# Patient Record
Sex: Female | Born: 1975 | Hispanic: No | Marital: Married | Smoking: Never smoker
Health system: Southern US, Community
[De-identification: ages and names within clinical notes are randomized; demographics above are authoritative.]

## PROBLEM LIST (undated history)

## (undated) DIAGNOSIS — E079 Disorder of thyroid, unspecified: Secondary | ICD-10-CM

## (undated) DIAGNOSIS — I10 Essential (primary) hypertension: Secondary | ICD-10-CM

## (undated) DIAGNOSIS — E0590 Thyrotoxicosis, unspecified without thyrotoxic crisis or storm: Secondary | ICD-10-CM

## (undated) DIAGNOSIS — G43909 Migraine, unspecified, not intractable, without status migrainosus: Secondary | ICD-10-CM

---

## 2008-04-18 ENCOUNTER — Inpatient Hospital Stay (HOSPITAL_COMMUNITY): Admission: AD | Admit: 2008-04-18 | Discharge: 2008-04-18 | Payer: Self-pay | Admitting: Gynecology

## 2008-04-23 ENCOUNTER — Inpatient Hospital Stay (HOSPITAL_COMMUNITY): Admission: RE | Admit: 2008-04-23 | Discharge: 2008-04-23 | Payer: Self-pay | Admitting: Gynecology

## 2008-07-09 ENCOUNTER — Inpatient Hospital Stay (HOSPITAL_COMMUNITY): Admission: AD | Admit: 2008-07-09 | Discharge: 2008-07-09 | Payer: Self-pay | Admitting: Obstetrics & Gynecology

## 2008-11-26 ENCOUNTER — Inpatient Hospital Stay (HOSPITAL_COMMUNITY): Admission: AD | Admit: 2008-11-26 | Discharge: 2008-11-28 | Payer: Self-pay | Admitting: Obstetrics and Gynecology

## 2009-03-09 IMAGING — CR DG CHEST 2V
2 series · 2 of 2 positions shown · non-contrast
Comparison: None

CLINICAL DATA: 16 weeks estimated gestational age with bronchitis
and shortness of breath

CHEST - 2 VIEW

[view not recorded (1 of 2)]
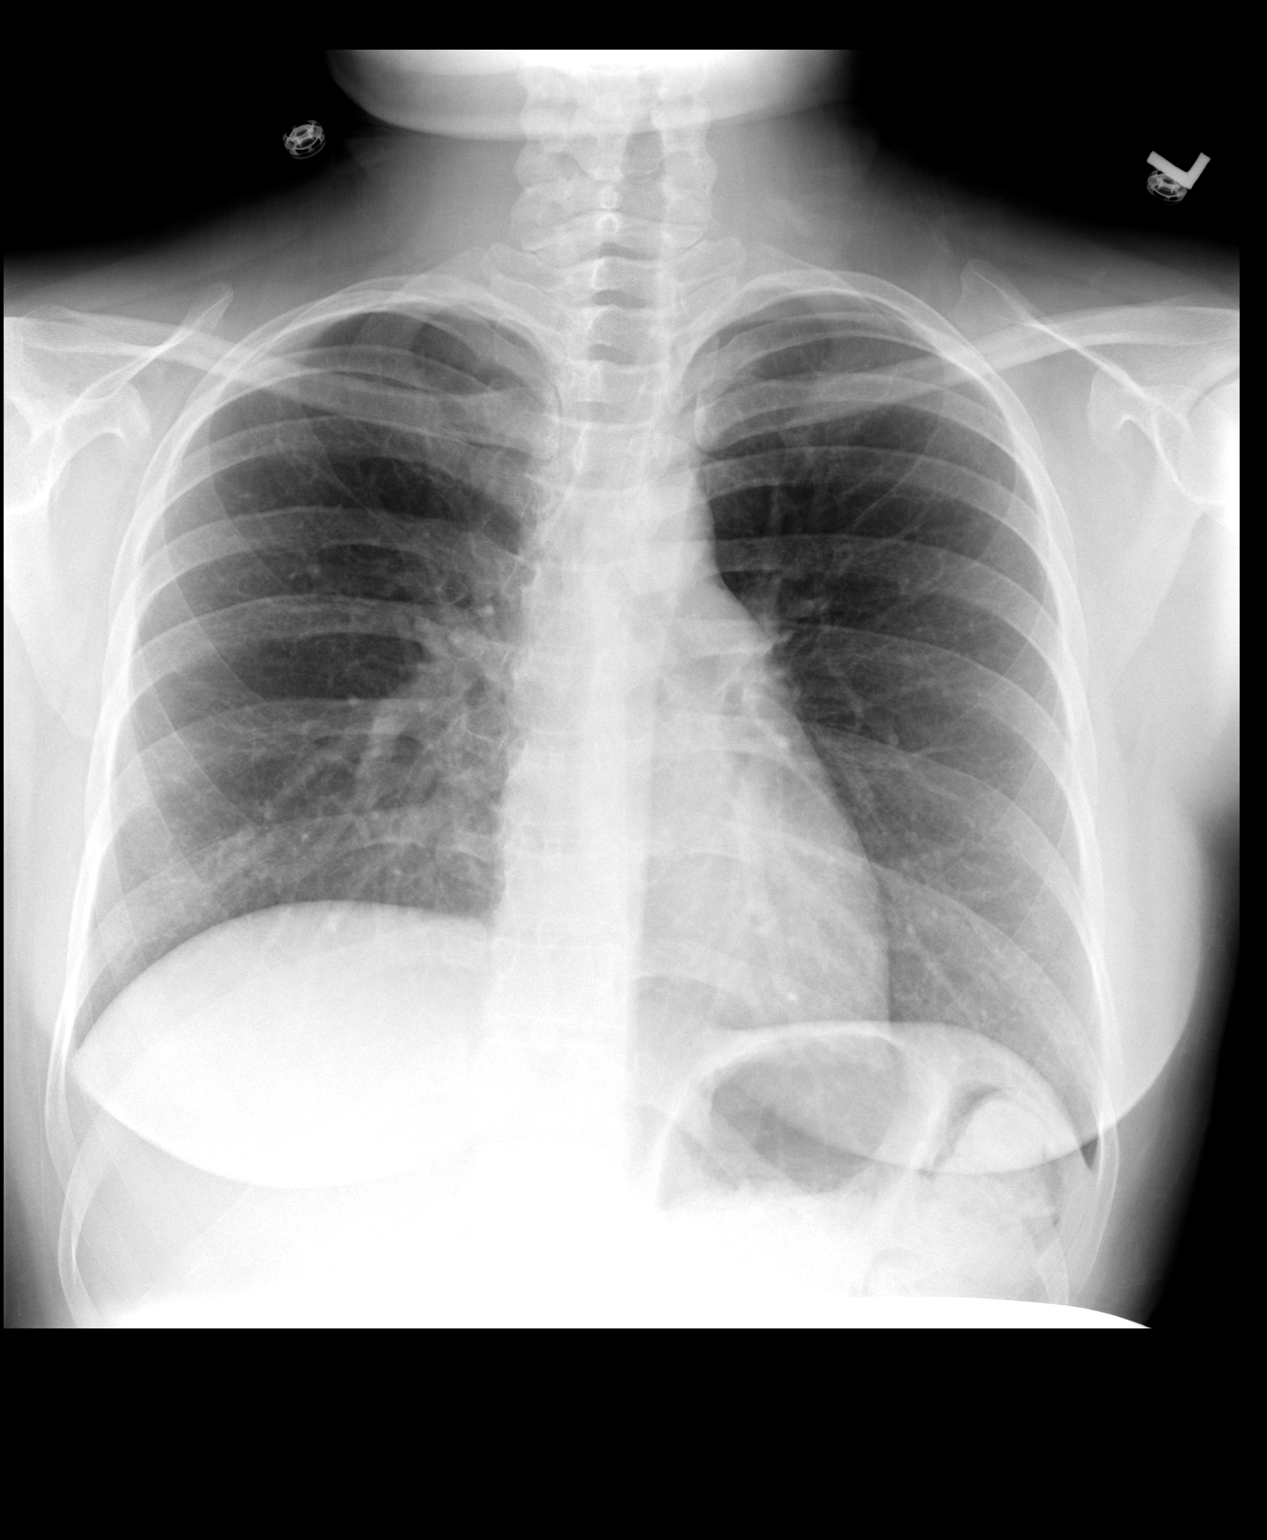

[view not recorded (2 of 2)]
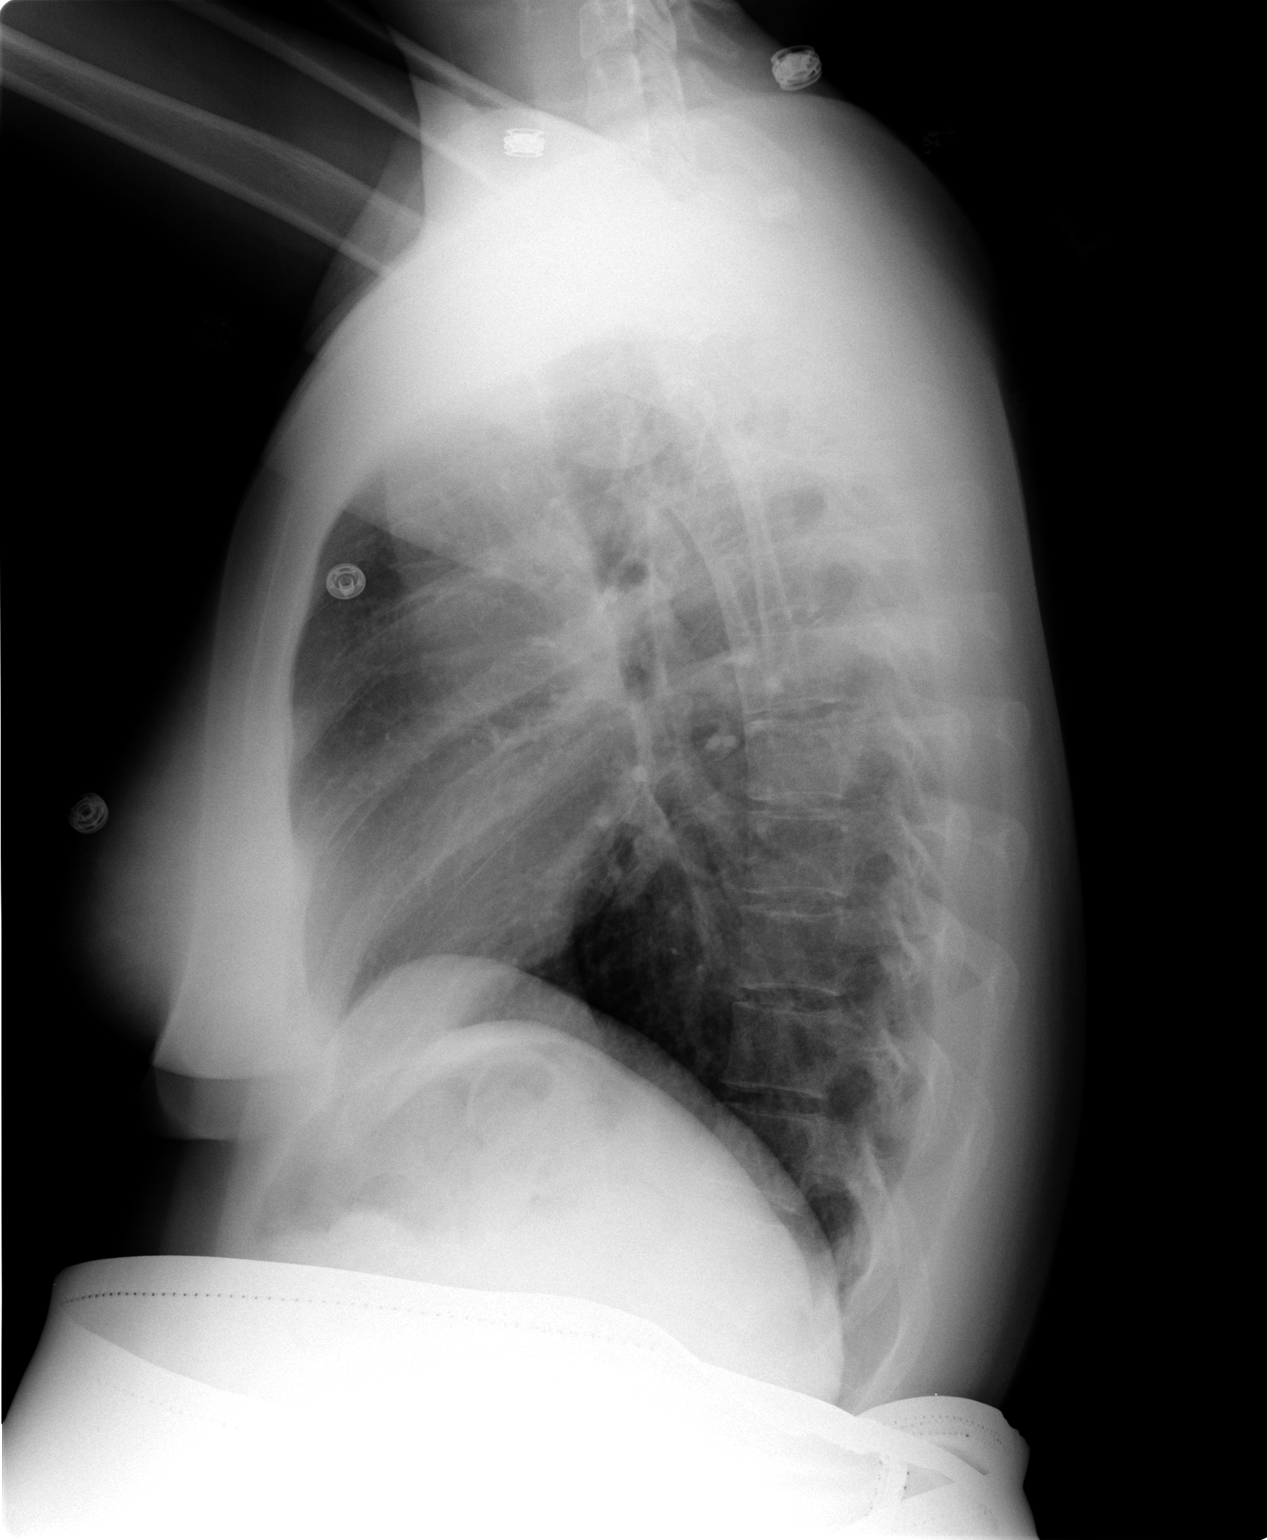

[2 of 2 positions shown; findings below may reference images not displayed]

FINDINGS: Heart and mediastinal contours are within normal limits.
Lung fields appear clear with no evidence for focal infiltrate or
congestive failure.  Bony structures appear intact.  Signs of
peribronchial cuffing are seen to suggest bronchitis
radiographically.
IMPRESSION: No acute disease.

## 2009-06-07 ENCOUNTER — Emergency Department (HOSPITAL_BASED_OUTPATIENT_CLINIC_OR_DEPARTMENT_OTHER): Admission: EM | Admit: 2009-06-07 | Discharge: 2009-06-07 | Payer: Self-pay | Admitting: Emergency Medicine

## 2009-06-07 ENCOUNTER — Ambulatory Visit: Payer: Self-pay | Admitting: Diagnostic Radiology

## 2009-06-17 ENCOUNTER — Inpatient Hospital Stay (HOSPITAL_COMMUNITY): Admission: AD | Admit: 2009-06-17 | Discharge: 2009-06-17 | Payer: Self-pay | Admitting: Obstetrics & Gynecology

## 2010-02-05 IMAGING — CR DG CHEST 2V
2 series · 2 of 2 positions shown · non-contrast
Comparison: Chest radiograph 07/09/2008

CLINICAL DATA: Cough, short of breath

CHEST - 2 VIEW

[w chest pa]
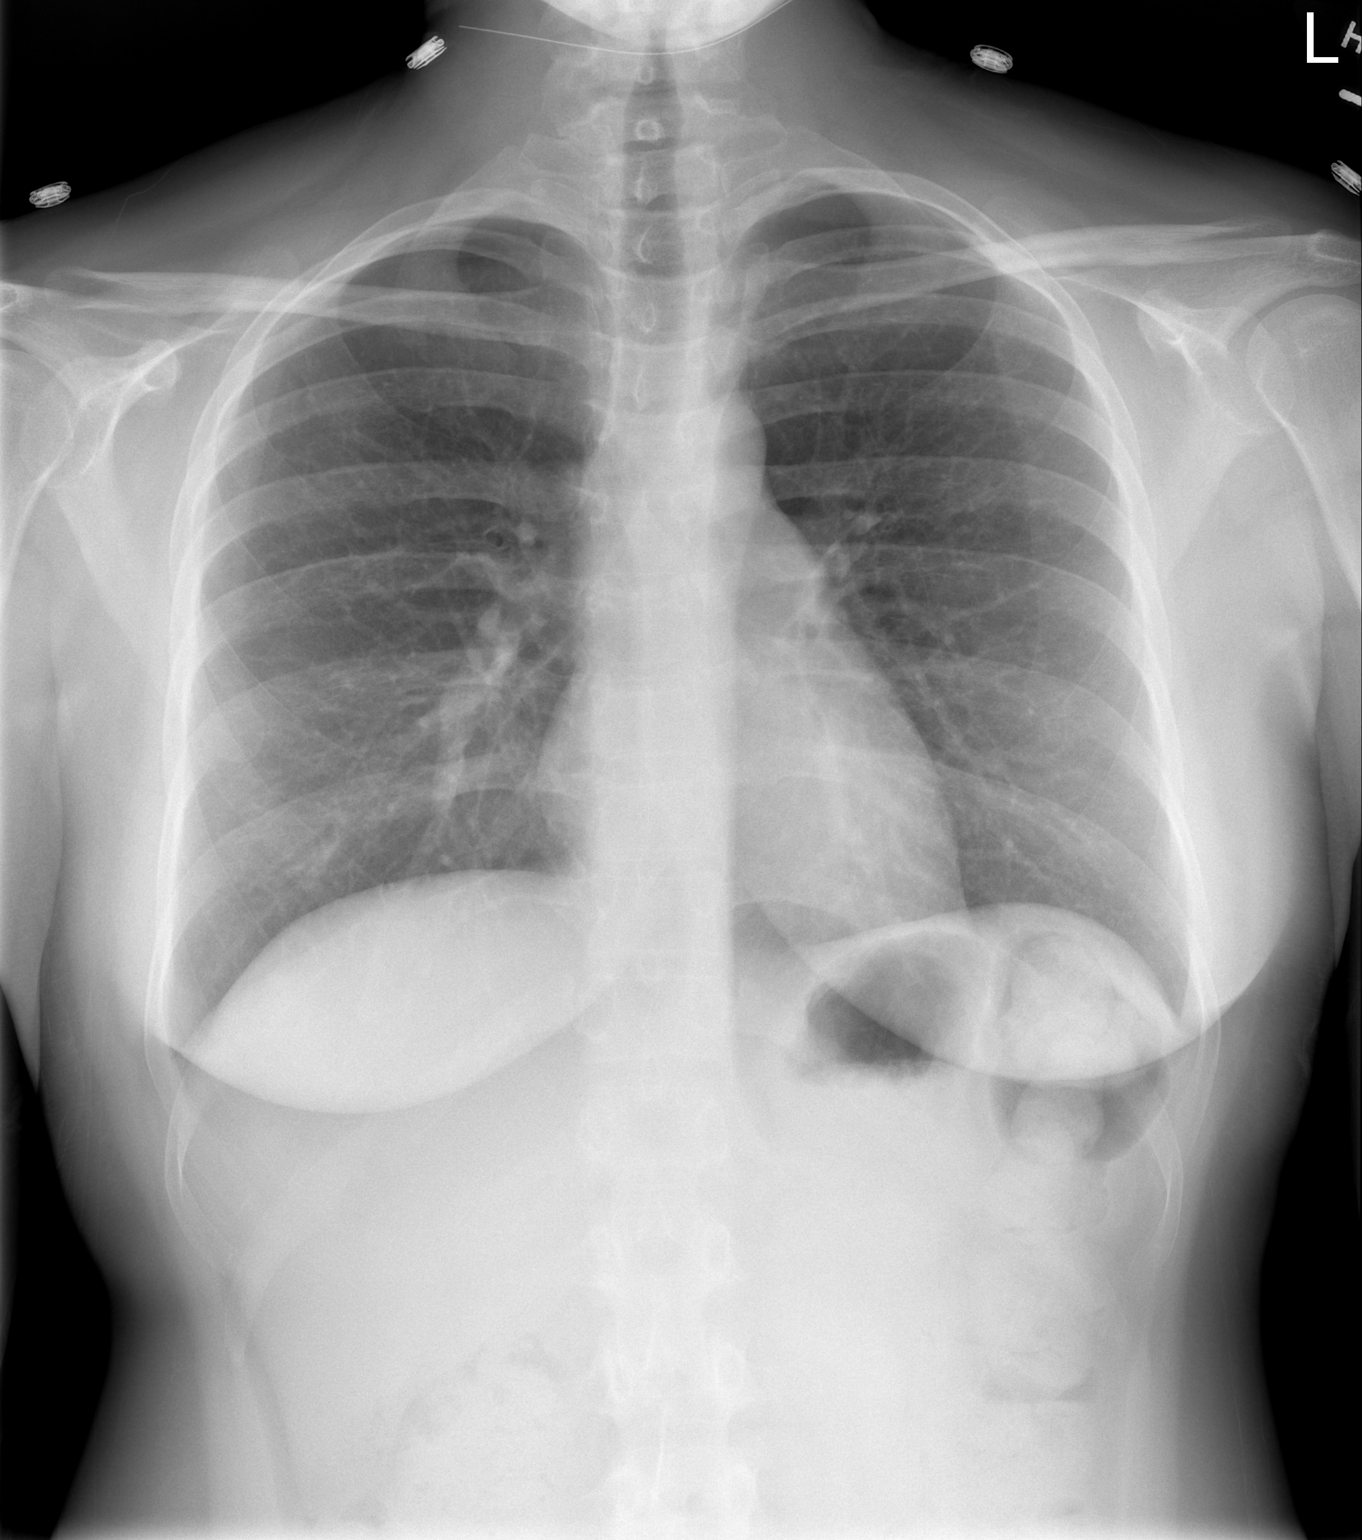

[w chest lat]
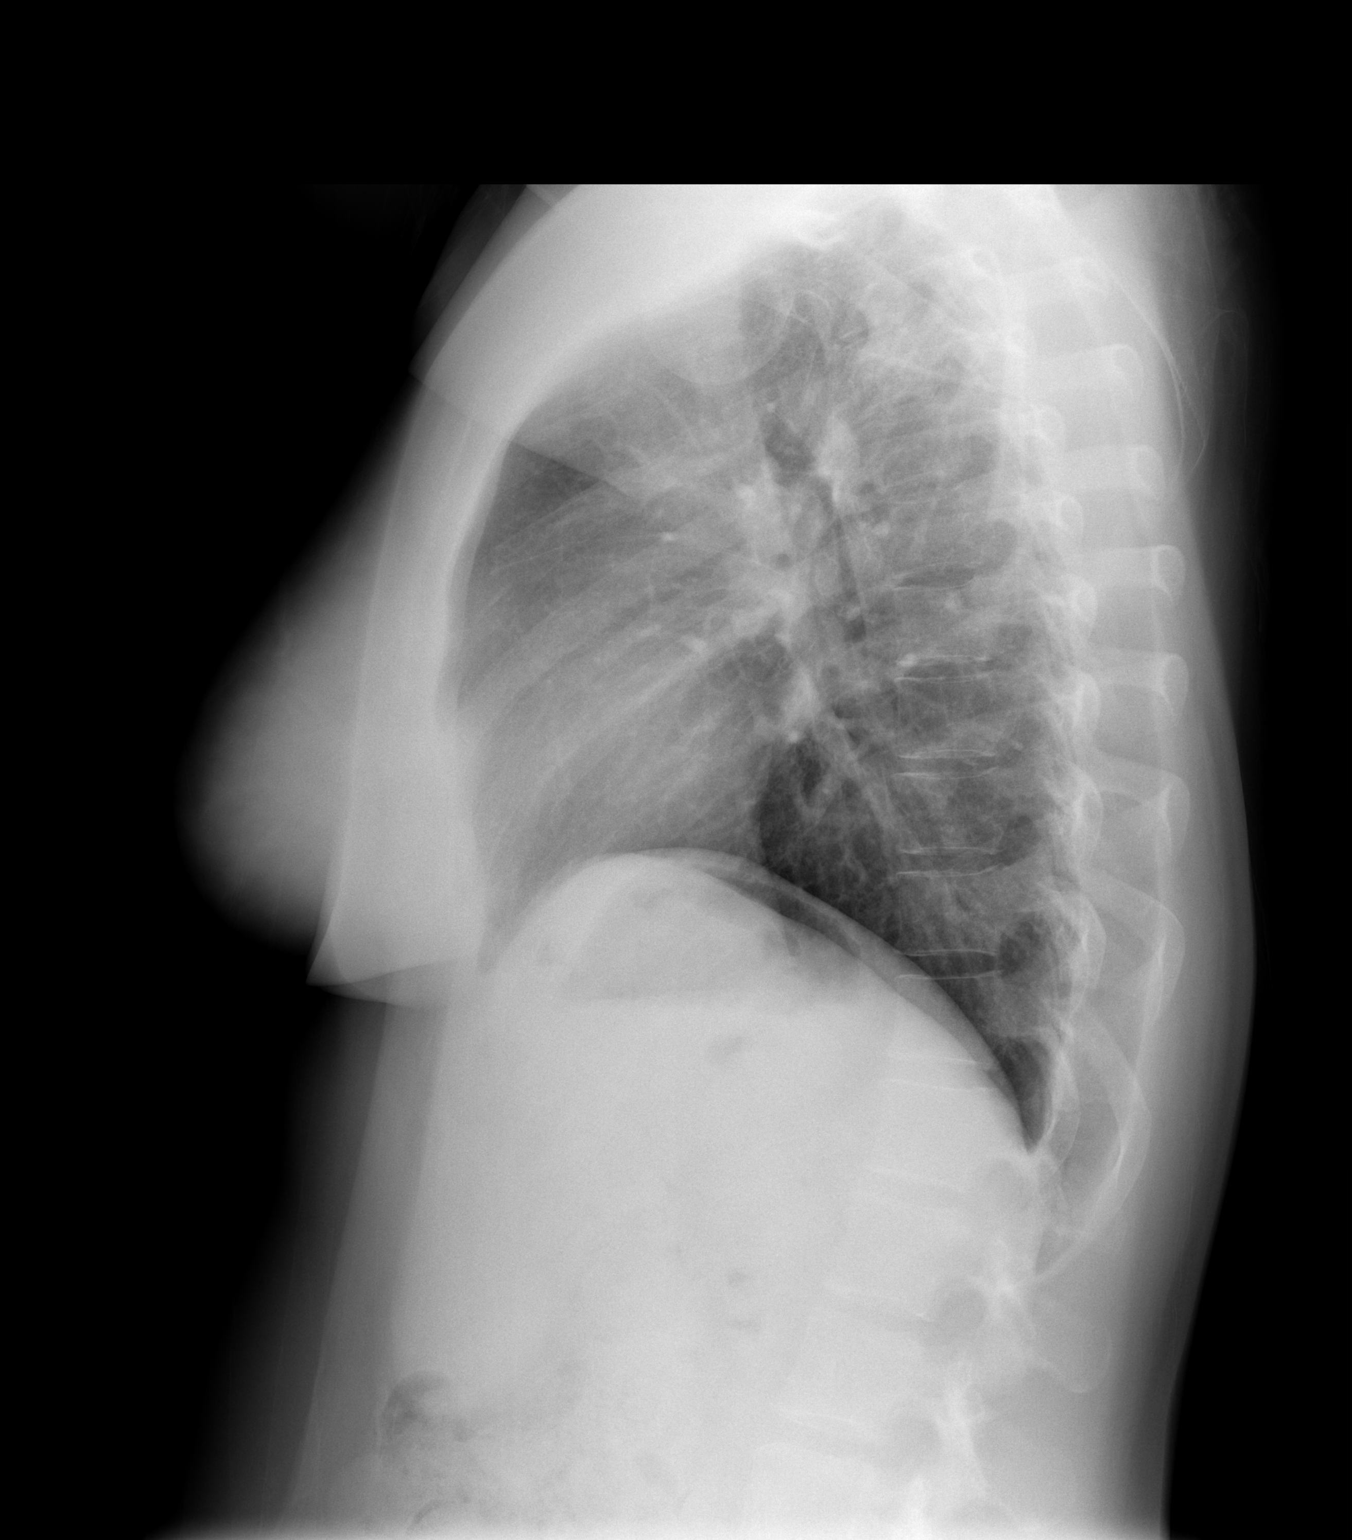

[2 of 2 positions shown; findings below may reference images not displayed]

FINDINGS: Normal mediastinum and cardiac silhouette.  Costophrenic
angles are clear.  No evidence effusion, infiltrate, or
pneumothorax.
IMPRESSION: No acute cardiopulmonary process.

## 2010-12-29 LAB — CBC
HCT: 41.7 % (ref 36.0–46.0)
Hemoglobin: 14 g/dL (ref 12.0–15.0)
MCHC: 33.5 g/dL (ref 30.0–36.0)
Platelets: 389 10*3/uL (ref 150–400)
RBC: 4.63 MIL/uL (ref 3.87–5.11)

## 2010-12-29 LAB — POCT PREGNANCY, URINE: Preg Test, Ur: NEGATIVE

## 2010-12-29 LAB — BASIC METABOLIC PANEL
BUN: 22 mg/dL (ref 6–23)
Chloride: 102 mEq/L (ref 96–112)
Glucose, Bld: 120 mg/dL — ABNORMAL HIGH (ref 70–99)
Potassium: 3 mEq/L — ABNORMAL LOW (ref 3.5–5.1)
Sodium: 137 mEq/L (ref 135–145)

## 2010-12-29 LAB — URINALYSIS, ROUTINE W REFLEX MICROSCOPIC
Bilirubin Urine: NEGATIVE
Hgb urine dipstick: NEGATIVE
Ketones, ur: NEGATIVE mg/dL
Nitrite: NEGATIVE
Protein, ur: NEGATIVE mg/dL
pH: 5.5 (ref 5.0–8.0)

## 2011-01-04 LAB — LACTATE DEHYDROGENASE
LDH: 116 U/L (ref 94–250)
LDH: 155 U/L (ref 94–250)

## 2011-01-04 LAB — CBC
Hemoglobin: 10.9 g/dL — ABNORMAL LOW (ref 12.0–15.0)
Hemoglobin: 12.1 g/dL (ref 12.0–15.0)
MCHC: 33.3 g/dL (ref 30.0–36.0)
MCV: 91 fL (ref 78.0–100.0)
Platelets: 198 10*3/uL (ref 150–400)
RBC: 3.52 MIL/uL — ABNORMAL LOW (ref 3.87–5.11)
RBC: 3.98 MIL/uL (ref 3.87–5.11)
RDW: 14.3 % (ref 11.5–15.5)
WBC: 12 10*3/uL — ABNORMAL HIGH (ref 4.0–10.5)

## 2011-01-04 LAB — COMPREHENSIVE METABOLIC PANEL
ALT: 16 U/L (ref 0–35)
AST: 22 U/L (ref 0–37)
Albumin: 2.5 g/dL — ABNORMAL LOW (ref 3.5–5.2)
Alkaline Phosphatase: 126 U/L — ABNORMAL HIGH (ref 39–117)
Alkaline Phosphatase: 160 U/L — ABNORMAL HIGH (ref 39–117)
BUN: 6 mg/dL (ref 6–23)
CO2: 21 mEq/L (ref 19–32)
CO2: 24 mEq/L (ref 19–32)
Calcium: 9 mg/dL (ref 8.4–10.5)
Calcium: 9.1 mg/dL (ref 8.4–10.5)
Chloride: 107 mEq/L (ref 96–112)
Creatinine, Ser: 0.7 mg/dL (ref 0.4–1.2)
GFR calc non Af Amer: >60 mL/min (ref >60)
Glucose, Bld: 73 mg/dL (ref 70–99)
Glucose, Bld: 77 mg/dL (ref 70–99)

## 2011-01-04 LAB — URIC ACID
Uric Acid, Serum: 6 mg/dL (ref 2.4–7.0)
Uric Acid, Serum: 6.3 mg/dL (ref 2.4–7.0)

## 2011-01-04 LAB — URINALYSIS, DIPSTICK ONLY
Hgb urine dipstick: NEGATIVE
Leukocytes, UA: NEGATIVE
Specific Gravity, Urine: 1.01 (ref 1.005–1.030)
pH: 6 (ref 5.0–8.0)

## 2011-01-04 LAB — PROTEIN, URINE, 24 HOUR
Protein, 24H Urine: 609 mg/d — ABNORMAL HIGH (ref 50–100)
Protein, Urine: 21 mg/dL

## 2011-01-04 LAB — RPR: RPR Ser Ql: NONREACTIVE

## 2011-02-06 NOTE — H&P (Signed)
[name], [name]              ACCOUNT NO.:  [id number]   MEDICAL RECORD NO.:  [id number]          PATIENT TYPE:  INP   LOCATION:  9164                          FACILITY:  [facility]   PHYSICIAN:  [name], M.D. DATE OF BIRTH:  Mar 28, 1976   DATE OF ADMISSION:  11/26/2008  DATE OF DISCHARGE:                              HISTORY & PHYSICAL   Patient is a 35 year old married black female, gravida 4, para 2-0-[name]-2  at 36-2/7 weeks for an [name] of December 22, 2008.  She presents with chief  complaint of spontaneous rupture of membranes at 6:30 a.m., as well as  contractions.  She does have labored breathing, as well as moderate  discomfort, good fetal movement, some pink to her discharge that she has  had since the leakage.  She reports that she has not had her GBS culture  done in the office yet.  She has been followed by the M.D. service at  [name].   HISTORY:  Remarkable for:  [name]. History of rapid labor.  2. History of postpartum depression.  3. Family stress.  4. History of anemia.  5. History of asthma.  6. History of SCDs.  7. Migraines.  8. History of mitral valve prolapse.   She denies headache or visual disturbances with the exception of phlegm-  like lint in my eyes which she notices from time to time and no right  upper quadrant pain.   OBSTETRICAL HISTORY:  [name] was an induced abortion 6 to 8 weeks'  gestation in 1996, no complications.  Gravida 2, spontaneous vaginal  delivery after 3 hours of labor at 39 weeks, that was November of 1998,  a female weighing 5 pounds 12 ounces.  She did have an epidural, no  complications.  [name] is her name.  Gravida 3, spontaneous vaginal  delivery.  Epidural, 6 hours of labor, 38 weeks' gestation, 5 pounds 13  ounces, female, that was in October of 2003.  Gravida 4 is current  pregnancy.   PAST MEDICAL HISTORY:   ALLERGIES:  SHE DENIES MEDICATION OR LATEX ALLERGIES.  NO OTHER  SENSITIVITIES.   MENSTRUAL HISTORY:  Menarche  age 54.  Monthly cycles, no abnormalities.  Reported an LMP of March 15, 2008, which originally gave her an [name] of  December 20, 2008.  She had an ultrasound June 28, 2008, gave her a best  [name] of December 22, 2008.  She has been on iron for anemia with previous  pregnancies.  Hyperemesis with first pregnancy, had to receive fluids.  Third pregnancy, postpartum depression, was put on an antidepressant.  Contraception, she has used birth control pills in the past, vaginal  film, foam, condoms.  Has had a cyst on ovary with 2nd pregnancy but no  problems since.  Treated for chlamydia 35 years of age.  Occasional  yeast infection.  Varicella as a child.  Reports heart murmur during her  2nd pregnancy.  Anemia during pregnancy, on iron supplement with 2nd  child.  Developed asthma with her 3rd pregnancy, has a p.r.n. albuterol  inhaler, reports laughter triggers.  Has had some  issues with some  constipation in the past.  Used to have frequent UTIs due to soft  drinks.  Migraines.  Postpartum depression.  Has fractured her right 4th  toe in the past.  Has had her wisdom teeth out x4.   FAMILY HISTORY:  Dad, high blood pressure, he is deceased.  Mom,  varicosities.  Mom diabetic, maternal grandmother, maternal aunt and an  uncle, paternal grandmother, and paternal uncle as well.  Dad and a  paternal uncle, dialysis.  Mom, migraines.  Dad, colon cancer.  Paternal  grandmother, stomach cancer.  Paternal uncle, throat cancer.  Paternal  grandfather, throat cancer.  Mom, OCD.  Maternal aunt, I am not sure  what kind of psychiatric illness she has.  Dad used drugs and alcohol.   GENETIC HISTORY:  Unremarkable.   SOCIAL HISTORY:  Married black female.  Father of the baby's name is  [name].  Patient has had a high school education.  Works part time  with [name].  Father of the baby has had 13 years of education.  Works  full time paper route maybe.  She denied alcohol, tobacco, or illicit  drug  use.   PRENATAL LABS:  O+.  Rh antibody screen negative.  Sickle cell negative.  RPR nonreactive.  Rubella immune.  Hepatitis surface antigen negative.  HIV nonreactive.  Gonorrhea and chlamydia cultures were negative.  Hemoglobin, May 27, 2008, 12.[name], hematocrit 36.4, platelets were  330.  GBS has not been done as I mentioned.   HISTORY OF PRESENT PREGNANCY:  She entered care for new OB interview  around 15-3/7 weeks, that was on July 01, 2008, same day she had her  new OB workup.  She was transferring from Dr. [name].  On that date,  she was reporting that she had had 2 cardio workups for her palpitations  and the workups were normal.  At that time, was discussion of father of  baby leaving for boot camp the next month and had limited support family  wise.  Father had recently died and does not get along with father of  baby's family as what was on her initial intake.  Her pregravid weight  was 140.  Her weight that day was 146.  She is 5 feet 2.  One of her  daughters did have the flu not long after that.  Had some issues around  the middle of the month of October with cough, congestion, was using her  inhaler.  She was sent to [facility] that day for breathing treatment, chest x-  ray.  She declined H1N1.  Complained of some right leg pain.  She had  her anatomy ultrasound at 18-6/7 weeks.  SIUP normal anatomy and fluid.  Cervix was measuring 3.87 cm.  Discussed with her Motrin for the achy  pain.  She was called in a prescription for Prefera-OB with DHA.  Complained of brown discharge at 22 and 6, as well as some upper  respiratory congestion.  No temperature.  She had been prescribed  Macrobid before.  Fetal fibronectin was done and it was negative.  Cervix was long and closed.  It was again recommended to get both flu  vaccines.  She had her [name]-hour [name] at 26 and 6.  Was referred to  cardiology at that time for palpitations again, had difficulty sleeping.  Plan was made for  ultrasound coming up for size less than dates.  She  was measuring about 2 weeks behind.  Had  an ultrasound at 24 and 6.  Estimated fetal weight was 2 pounds 5 ounces.  Normal fluid vertex.  Posterior placenta grade [name].  Her [name]-hour [name] was abnormal at 137.  It was  scheduled for 3 hours.  Her hemoglobin at that time was 11.4.  Three-  hour [name] was within normal limits.  She did have cold sore with  prescribed acyclovir cream.  She was also having some reflux and was  recommended Tums.  Did have an NSE for contractions.  Had [name] contraction  in 30 minutes.  At 31 weeks, she was weighing about 158.  She did have  3+ glucosuria but her Dextrostix was 120.  She had an ultrasound that  day for size less than dates.  Estimated fetal weight was 3 pounds 6  ounces, 55th to 57th percentile.  AFI was 16.  Cervix was 4.9.  Posterior placenta grade 2 and that 31-week notation in her prenatal  record is available at the time of this dictation.   OBJECTIVE:  VITAL SIGNS ON ADMISSION:  Her blood pressures were 154/106,  163/105, 148/95.  Heart rate 73.  Respirations 21.  Temperature was  98.3.  Fetal heart rate reactive, no decels, 135.  TOCO.  Uterine  contractions every 2 to 4 minutes.  Moderate on palpation.   PHYSICAL EXAM:  GENERAL:  She had labored breathing and grimace but  alert and oriented x3.  HEENT:  Within normal limits, grossly intact.  CARDIOVASCULAR:  Regular rate and rhythm without murmur.  LUNGS:  Clear to auscultation bilaterally.  ABDOMEN:  Soft, nontender, and gravid.  PELVIC:  Grossly ruptured.  Clear fluid.  Cervix was 2 to 3, 80, -2, and  vertex.  EXTREMITIES:  Mild generalized edema in lower extremities.  DTRs 2 to 3+  with [name] beat of clonus bilaterally.   IMPRESSION:  [name]. Intrauterine pregnancy at 36 and 2.  2. Spontaneous rupture of membranes with labor.  3. Group B streptococcus unknown.  4. Elevated blood pressures.  5. Reactive fetal heart tracing.   PLAN:  [name]. Admit  to birthing suites with Dr. Estanislado Pandy as attending physician.  2. Routine L and D orders per Dr. Estanislado Pandy.  Penicillin G IV per GBS      protocol for unknown status.  PIH labs and sending a UA after a      Foley is in place following epidural.  3. M.D. is to follow.      Candice Rembrandt, CNM      [name], M.D.  Electronically Signed    CHS/MEDQ  D:  11/26/2008  T:  11/26/2008  Job:  [id number]

## 2011-06-22 LAB — URINALYSIS, ROUTINE W REFLEX MICROSCOPIC
Ketones, ur: 40 — AB
Leukocytes, UA: NEGATIVE
Protein, ur: NEGATIVE
Specific Gravity, Urine: >1.03 — ABNORMAL HIGH

## 2011-06-22 LAB — CBC
Hemoglobin: 13.2
MCHC: 33.9
Platelets: 335
WBC: 11.6 — ABNORMAL HIGH

## 2011-06-22 LAB — WET PREP, GENITAL: Trich, Wet Prep: NONE SEEN

## 2011-06-22 LAB — ABO/RH: ABO/RH(D): O POS

## 2011-06-22 LAB — POCT PREGNANCY, URINE
Operator id: 28886
Preg Test, Ur: POSITIVE

## 2011-06-22 LAB — RPR: RPR Ser Ql: NONREACTIVE

## 2011-06-26 LAB — DIFFERENTIAL
Basophils Relative: 1
Eosinophils Relative: 1
Lymphocytes Relative: 26
Monocytes Absolute: 0.7
Monocytes Relative: 10
Neutrophils Relative %: 62

## 2011-06-26 LAB — CBC
MCV: 91
RBC: 3.69 — ABNORMAL LOW
WBC: 7

## 2012-12-13 ENCOUNTER — Emergency Department (HOSPITAL_BASED_OUTPATIENT_CLINIC_OR_DEPARTMENT_OTHER)
Admission: EM | Admit: 2012-12-13 | Discharge: 2012-12-13 | Disposition: A | Discharge status: Home / Self Care | Attending: Emergency Medicine | Admitting: Emergency Medicine

## 2012-12-13 ENCOUNTER — Encounter (HOSPITAL_BASED_OUTPATIENT_CLINIC_OR_DEPARTMENT_OTHER): Payer: Self-pay | Admitting: *Deleted

## 2012-12-13 DIAGNOSIS — Z79899 Other long term (current) drug therapy: Secondary | ICD-10-CM | POA: Insufficient documentation

## 2012-12-13 DIAGNOSIS — I10 Essential (primary) hypertension: Secondary | ICD-10-CM | POA: Insufficient documentation

## 2012-12-13 DIAGNOSIS — M7989 Other specified soft tissue disorders: Secondary | ICD-10-CM | POA: Insufficient documentation

## 2012-12-13 DIAGNOSIS — M436 Torticollis: Secondary | ICD-10-CM | POA: Insufficient documentation

## 2012-12-13 DIAGNOSIS — Z8639 Personal history of other endocrine, nutritional and metabolic disease: Secondary | ICD-10-CM | POA: Insufficient documentation

## 2012-12-13 DIAGNOSIS — G43909 Migraine, unspecified, not intractable, without status migrainosus: Secondary | ICD-10-CM | POA: Insufficient documentation

## 2012-12-13 DIAGNOSIS — R51 Headache: Secondary | ICD-10-CM | POA: Insufficient documentation

## 2012-12-13 DIAGNOSIS — Z862 Personal history of diseases of the blood and blood-forming organs and certain disorders involving the immune mechanism: Secondary | ICD-10-CM | POA: Insufficient documentation

## 2012-12-13 DIAGNOSIS — M542 Cervicalgia: Secondary | ICD-10-CM | POA: Insufficient documentation

## 2012-12-13 HISTORY — DX: Thyrotoxicosis, unspecified without thyrotoxic crisis or storm: E05.90

## 2012-12-13 HISTORY — DX: Essential (primary) hypertension: I10

## 2012-12-13 HISTORY — DX: Disorder of thyroid, unspecified: E07.9

## 2012-12-13 HISTORY — DX: Migraine, unspecified, not intractable, without status migrainosus: G43.909

## 2012-12-13 MED ORDER — CYCLOBENZAPRINE HCL 10 MG PO TABS: 10.0000 mg | ORAL_TABLET | Freq: Three times a day (TID) | ORAL | Status: AC | PRN

## 2012-12-13 MED ORDER — PREDNISONE 10 MG PO TABS: 20.0000 mg | ORAL_TABLET | Freq: Two times a day (BID) | ORAL | Status: AC

## 2012-12-13 NOTE — ED Notes (Signed)
Pt states she returned from [place] on the 5th and on the 7th began having neck pain. Neck pain has not gotten better and is causing more frequent migraines. Sore to touch. Legs "going to sleep more often". Able to touch chin to chest and denies fever. PERL.

## 2012-12-13 NOTE — ED Provider Notes (Signed)
History    This chart was scribed for Geoffery Lyons, MD scribed by Magnus Sinning. The patient was seen in room MH06/MH06 at 17:00   CSN: [id number]  Arrival date & time 12/13/12  1554  Chief Complaint  Patient presents with  . Neck Pain    (Consider location/radiation/quality/duration/timing/severity/associated sxs/prior treatment) HPI [name] is a 37 y.o. female who presents to the Emergency Department complaining of gradually worsening neck stiffness and pain located at left side at base of shoulders that goes down into back, onset 17 days with associated HA.  The patient states they moved from [place] 17 days ago and that two days later she started having stiff neck at the shoulders that now goes down into her back. She states she has taken OTC pain medications, heat application, and warm baths and showers, all with no relief.  Reports hx of stiffness to neck that would normally be relieved after a day. She says her flight from [place] was 8 hours.   The patient notes that  feet and hands were swollen last night, which she notes has been recurrent prior. She says she has hx of hyperthyroid and HTN.   Past Medical History  Diagnosis Date  . Migraines   . Hypertension   . Thyroid disease   . Hyperthyroidism     History reviewed. No pertinent past surgical history.  History reviewed. No pertinent family history.  History  Substance Use Topics  . Smoking status: Never Smoker   . Smokeless tobacco: Not on file  . Alcohol Use: Yes    Review of Systems  HENT: Positive for neck pain and neck stiffness.   Neurological: Positive for headaches.  All other systems reviewed and are negative.    Allergies  Review of patient's allergies indicates no known allergies.  Home Medications   Current Outpatient Rx  Name  Route  Sig  Dispense  Refill  . drospirenone-ethinyl estradiol (YAZ,GIANVI,LORYNA) 3-0.02 MG tablet   Oral   Take 1 tablet by mouth daily.         .  propranolol (INDERAL) 80 MG tablet   Oral   Take 80 mg by mouth 3 (three) times daily.         . sertraline (ZOLOFT) 100 MG tablet   Oral   Take 100 mg by mouth daily.         . traZODone (DESYREL) 50 MG tablet   Oral   Take 50 mg by mouth at bedtime.         . triamterene-hydrochlorothiazide (MAXZIDE-25) 37.5-25 MG per tablet   Oral   Take 1 tablet by mouth daily.         Marland Kitchen zolpidem (AMBIEN) 10 MG tablet   Oral   Take 10 mg by mouth at bedtime as needed for sleep.           BP 123/80  Pulse 74  Temp(Src) 98.3 F (36.8 C) (Oral)  Resp 20  Ht 5\' 2"  (1.575 m)  Wt 152 lb (68.947 kg)  BMI 27.79 kg/m2  SpO2 99%  LMP 10/15/2012  Physical Exam  Nursing note and vitals reviewed. Constitutional: She is oriented to person, place, and time. She appears well-developed and well-nourished. No distress.  HENT:  Head: Normocephalic and atraumatic.  Eyes: Conjunctivae and EOM are normal.  Neck: Neck supple. No tracheal deviation present.  There is tenderness to palpation in the soft tissues of the left lower posterior neck. There is pain with ROM.   Cardiovascular:  Normal rate.   Pulmonary/Chest: Effort normal. No respiratory distress.  Abdominal: She exhibits no distension.  Musculoskeletal: Normal range of motion.  The ulnar radial pulses are easily palpable bilaterally.   Neurological: She is alert and oriented to person, place, and time. No sensory deficit. Coordination normal.  Strength is 5 out of 5 in the bilateral upper extremities. Coordination is intact.  Skin: Skin is dry.  Psychiatric: She has a normal mood and affect. Her behavior is normal.    ED Course  Procedures (including critical care time) DIAGNOSTIC STUDIES: Oxygen Saturation is 99% on room air, normal by my interpretation.    COORDINATION OF CARE: 17:01: Physical exam performed.  Labs Reviewed - No data to display No results found.   No diagnosis found.    MDM  No indication for  imaging studies.  She appears well and in minimal discomfort.  As nsaids have not helped, will prescribe a short course of prednisone and flexeril.  She is to follow up in one week if not improving.    I personally performed the services described in this documentation, which was scribed in my presence. The recorded information has been reviewed and is accurate.          Geoffery Lyons, MD 12/14/12 [phone]

## 2015-10-29 ENCOUNTER — Encounter (HOSPITAL_BASED_OUTPATIENT_CLINIC_OR_DEPARTMENT_OTHER): Payer: Self-pay | Admitting: *Deleted

## 2015-10-29 ENCOUNTER — Emergency Department (HOSPITAL_BASED_OUTPATIENT_CLINIC_OR_DEPARTMENT_OTHER)
Admission: EM | Admit: 2015-10-29 | Discharge: 2015-10-29 | Disposition: A | Discharge status: Home / Self Care | Attending: Physician Assistant | Admitting: Physician Assistant

## 2015-10-29 DIAGNOSIS — Z3202 Encounter for pregnancy test, result negative: Secondary | ICD-10-CM | POA: Diagnosis not present

## 2015-10-29 DIAGNOSIS — Z79899 Other long term (current) drug therapy: Secondary | ICD-10-CM | POA: Diagnosis not present

## 2015-10-29 DIAGNOSIS — I10 Essential (primary) hypertension: Secondary | ICD-10-CM | POA: Insufficient documentation

## 2015-10-29 DIAGNOSIS — Z8639 Personal history of other endocrine, nutritional and metabolic disease: Secondary | ICD-10-CM | POA: Diagnosis not present

## 2015-10-29 DIAGNOSIS — G43909 Migraine, unspecified, not intractable, without status migrainosus: Secondary | ICD-10-CM | POA: Diagnosis present

## 2015-10-29 DIAGNOSIS — G43809 Other migraine, not intractable, without status migrainosus: Secondary | ICD-10-CM

## 2015-10-29 LAB — PREGNANCY, URINE: PREG TEST UR: NEGATIVE

## 2015-10-29 MED ORDER — PROCHLORPERAZINE EDISYLATE 5 MG/ML IJ SOLN
10.0000 mg | Freq: Once | INTRAMUSCULAR | Status: AC
  Administered 2015-10-29: 10 mg via INTRAVENOUS
  Filled 2015-10-29: qty 2

## 2015-10-29 MED ORDER — DIPHENHYDRAMINE HCL 50 MG/ML IJ SOLN
25.0000 mg | Freq: Once | INTRAMUSCULAR | Status: AC
  Administered 2015-10-29: 25 mg via INTRAVENOUS
  Filled 2015-10-29: qty 1

## 2015-10-29 MED ORDER — SUMATRIPTAN SUCCINATE 100 MG PO TABS: 100.0000 mg | ORAL_TABLET | ORAL | Status: DC | PRN

## 2015-10-29 MED ORDER — KETOROLAC TROMETHAMINE 30 MG/ML IJ SOLN
30.0000 mg | Freq: Once | INTRAMUSCULAR | Status: AC
  Administered 2015-10-29: 30 mg via INTRAVENOUS
  Filled 2015-10-29: qty 1

## 2015-10-29 MED ORDER — SODIUM CHLORIDE 0.9 % IV BOLUS (SEPSIS)
1000.0000 mL | Freq: Once | INTRAVENOUS | Status: AC
  Administered 2015-10-29: 1000 mL via INTRAVENOUS

## 2015-10-29 NOTE — ED Provider Notes (Signed)
CSN: [id number]     Arrival date & time 10/29/15  1544 History  By signing my name below, I, Freida Busman, attest that this documentation has been prepared under the direction and in the presence of Kaipo Ardis Randall An, MD . Electronically Signed: Freida Busman, Scribe. 10/29/2015. 4:26 PM.    Chief Complaint  Patient presents with  . Migraine    The history is provided by the patient. No language interpreter was used.   HPI Comments:  [name] is a 40 y.o. female with a history of HTN, and migraines HAs, who presents to the Emergency Department complaining of a constant stabbing HA since yesterday with 10/10 pain . She notes her pain is similar to past migraines. She has taken tylenol and nucynta with minimal relief. She is currently out of her imitrex due to insurance issues. Pt reports associated nausea and vomiting. No unilateral deficits. She is not currently followed by a neurologist.  Past Medical History  Diagnosis Date  . Migraines   . Hypertension   . Thyroid disease   . Hyperthyroidism    History reviewed. No pertinent past surgical history. No family history on file. Social History  Substance Use Topics  . Smoking status: Never Smoker   . Smokeless tobacco: Never Used  . Alcohol Use: Yes     Comment: weekends   OB History    No data available     Review of Systems  10 systems reviewed and all are negative for acute change except as noted in the HPI.   Allergies  Review of patient's allergies indicates no known allergies.  Home Medications   Prior to Admission medications   Medication Sig Start Date End Date Taking? Authorizing Provider  labetalol (NORMODYNE) 100 MG tablet Take 100 mg by mouth 2 (two) times daily.   Yes Historical Provider, MD  sertraline (ZOLOFT) 100 MG tablet Take 100 mg by mouth daily.   Yes Historical Provider, MD  traZODone (DESYREL) 50 MG tablet Take 50 mg by mouth at bedtime.   Yes Historical Provider, MD  cyclobenzaprine  (FLEXERIL) 10 MG tablet Take 1 tablet (10 mg total) by mouth 3 (three) times daily as needed for muscle spasms. 12/13/12   Geoffery Lyons, MD  drospirenone-ethinyl estradiol Pierre Bali) 3-0.02 MG tablet Take 1 tablet by mouth daily.    Historical Provider, MD  predniSONE (DELTASONE) 10 MG tablet Take 2 tablets (20 mg total) by mouth 2 (two) times daily. 12/13/12   Geoffery Lyons, MD  propranolol (INDERAL) 80 MG tablet Take 80 mg by mouth 3 (three) times daily.    Historical Provider, MD  triamterene-hydrochlorothiazide (MAXZIDE-25) 37.5-25 MG per tablet Take 1 tablet by mouth daily.    Historical Provider, MD  zolpidem (AMBIEN) 10 MG tablet Take 10 mg by mouth at bedtime as needed for sleep.    Historical Provider, MD   BP 125/85 mmHg  Pulse 98  Temp(Src) 98.3 F (36.8 C) (Oral)  Resp 16  Ht  (1.575 m)  Wt 155 lb (70.308 kg)  BMI 28.34 kg/m2  SpO2 98% Physical Exam  Constitutional: She is oriented to person, place, and time. She appears well-developed and well-nourished. No distress.  HENT:  Head: Normocephalic and atraumatic.  Eyes: Conjunctivae are normal.  Pt is photophobic on exam  Cardiovascular: Normal rate, regular rhythm and normal heart sounds.   Pulmonary/Chest: Effort normal and breath sounds normal. No respiratory distress.  Abdominal: She exhibits no distension.  Musculoskeletal: Normal range of motion.  Neurological:  She is alert and oriented to person, place, and time. No cranial nerve deficit. Coordination normal.  Skin: Skin is warm and dry.  Psychiatric: She has a normal mood and affect.  Nursing note and vitals reviewed.   ED Course  Procedures  DIAGNOSTIC STUDIES:  Oxygen Saturation is 98% on RA, normal by my interpretation.    COORDINATION OF CARE:  4:18 PM Will order U-preg and migraine cocktail. Discussed treatment plan with pt at bedside and pt agreed to plan.  Labs Review Labs Reviewed  PREGNANCY, URINE    I have personally reviewed and  evaluated theselab results as part of my medical decision-making.    MDM   Final diagnoses:  None    Patient is 40 year old female presents with migraine. Patient reports she's had migraines since middle school. She states it feels like her normal migraines. She has been unable to get more Sumitriptan because of insurance issues.  She has tried over the counter medications with no help.   We will treat migraine today. Do not suspect any dangerous patholgy: ICH, SAH given it is her usual presentation of migraine.   I personally performed the services described in this documentation, which was scribed in my presence. The recorded information has been reviewed and is accurate.   6:13 PM Patient improved. Taking PO. Will discharge home to follow up with PCP.    Norvell Ureste Randall An, MD 10/29/15 [phone]

## 2015-10-29 NOTE — ED Notes (Signed)
PO fluid challenge

## 2015-10-29 NOTE — ED Notes (Signed)
States has tried "some pain pill" , ibuprofen, states does not see a Neurologist for migraines

## 2015-10-29 NOTE — ED Notes (Signed)
Migraine head x 2 days- vomited x 5 since 0300

## 2015-10-29 NOTE — ED Notes (Signed)
States has a HA and N/V since yesterday. States has a hx of migraine headaches

## 2015-10-29 NOTE — ED Notes (Signed)
Safety measures in place, comfort measures provided

## 2015-10-29 NOTE — Discharge Instructions (Signed)
Please follow up with your PCP as needed.  Migraine Headache A migraine headache is an intense, throbbing pain on one or both sides of your head. A migraine can last for 30 minutes to several hours. CAUSES  The exact cause of a migraine headache is not always known. However, a migraine may be caused when nerves in the brain become irritated and release chemicals that cause inflammation. This causes pain. Certain things may also trigger migraines, such as:  Alcohol.  Smoking.  Stress.  Menstruation.  Aged cheeses.  Foods or drinks that contain nitrates, glutamate, aspartame, or tyramine.  Lack of sleep.  Chocolate.  Caffeine.  Hunger.  Physical exertion.  Fatigue.  Medicines used to treat chest pain (nitroglycerine), birth control pills, estrogen, and some blood pressure medicines. SIGNS AND SYMPTOMS  Pain on one or both sides of your head.  Pulsating or throbbing pain.  Severe pain that prevents daily activities.  Pain that is aggravated by any physical activity.  Nausea, vomiting, or both.  Dizziness.  Pain with exposure to bright lights, loud noises, or activity.  General sensitivity to bright lights, loud noises, or smells. Before you get a migraine, you may get warning signs that a migraine is coming (aura). An aura may include:  Seeing flashing lights.  Seeing bright spots, halos, or zigzag lines.  Having tunnel vision or blurred vision.  Having feelings of numbness or tingling.  Having trouble talking.  Having muscle weakness. DIAGNOSIS  A migraine headache is often diagnosed based on:  Symptoms.  Physical exam.  A CT scan or MRI of your head. These imaging tests cannot diagnose migraines, but they can help rule out other causes of headaches. TREATMENT Medicines may be given for pain and nausea. Medicines can also be given to help prevent recurrent migraines.  HOME CARE INSTRUCTIONS  Only take over-the-counter or prescription medicines  for pain or discomfort as directed by your health care provider. The use of long-term narcotics is not recommended.  Lie down in a dark, quiet room when you have a migraine.  Keep a journal to find out what may trigger your migraine headaches. For example, write down:  What you eat and drink.  How much sleep you get.  Any change to your diet or medicines.  Limit alcohol consumption.  Quit smoking if you smoke.  Get 7-9 hours of sleep, or as recommended by your health care provider.  Limit stress.  Keep lights dim if bright lights bother you and make your migraines worse. SEEK IMMEDIATE MEDICAL CARE IF:   Your migraine becomes severe.  You have a fever.  You have a stiff neck.  You have vision loss.  You have muscular weakness or loss of muscle control.  You start losing your balance or have trouble walking.  You feel faint or pass out.  You have severe symptoms that are different from your first symptoms. MAKE SURE YOU:   Understand these instructions.  Will watch your condition.  Will get help right away if you are not doing well or get worse.   This information is not intended to replace advice given to you by your health care provider. Make sure you discuss any questions you have with your health care provider.   Document Released: 09/10/2005 Document Revised: 10/01/2014 Document Reviewed: 05/18/2013 Elsevier Interactive Patient Education [employer]

## 2015-10-29 NOTE — ED Notes (Signed)
Pt up to bathroom for UA

## 2015-10-29 NOTE — ED Notes (Signed)
MD at bedside. 

## 2015-11-21 ENCOUNTER — Encounter (HOSPITAL_BASED_OUTPATIENT_CLINIC_OR_DEPARTMENT_OTHER): Payer: Self-pay | Admitting: *Deleted

## 2015-11-21 ENCOUNTER — Emergency Department (HOSPITAL_BASED_OUTPATIENT_CLINIC_OR_DEPARTMENT_OTHER)
Admission: EM | Admit: 2015-11-21 | Discharge: 2015-11-21 | Disposition: A | Discharge status: Home / Self Care | Attending: Emergency Medicine | Admitting: Emergency Medicine

## 2015-11-21 DIAGNOSIS — Z793 Long term (current) use of hormonal contraceptives: Secondary | ICD-10-CM | POA: Diagnosis not present

## 2015-11-21 DIAGNOSIS — Z8639 Personal history of other endocrine, nutritional and metabolic disease: Secondary | ICD-10-CM | POA: Diagnosis not present

## 2015-11-21 DIAGNOSIS — R51 Headache: Secondary | ICD-10-CM

## 2015-11-21 DIAGNOSIS — I10 Essential (primary) hypertension: Secondary | ICD-10-CM

## 2015-11-21 DIAGNOSIS — Z79899 Other long term (current) drug therapy: Secondary | ICD-10-CM | POA: Insufficient documentation

## 2015-11-21 DIAGNOSIS — H53149 Visual discomfort, unspecified: Secondary | ICD-10-CM | POA: Insufficient documentation

## 2015-11-21 DIAGNOSIS — Z7952 Long term (current) use of systemic steroids: Secondary | ICD-10-CM | POA: Insufficient documentation

## 2015-11-21 DIAGNOSIS — R519 Headache, unspecified: Secondary | ICD-10-CM

## 2015-11-21 DIAGNOSIS — R112 Nausea with vomiting, unspecified: Secondary | ICD-10-CM | POA: Diagnosis not present

## 2015-11-21 MED ORDER — SUMATRIPTAN SUCCINATE 6 MG/0.5ML ~~LOC~~ SOLN
6.0000 mg | Freq: Once | SUBCUTANEOUS | Status: AC
  Administered 2015-11-21: 6 mg via SUBCUTANEOUS
  Filled 2015-11-21: qty 0.5

## 2015-11-21 MED ORDER — ONDANSETRON HCL 4 MG/2ML IJ SOLN
INTRAMUSCULAR | Status: AC
  Administered 2015-11-21: 4 mg via INTRAVENOUS
  Filled 2015-11-21: qty 2

## 2015-11-21 MED ORDER — ONDANSETRON HCL 4 MG/2ML IJ SOLN
4.0000 mg | Freq: Once | INTRAMUSCULAR | Status: AC
  Administered 2015-11-21: 4 mg via INTRAVENOUS

## 2015-11-21 MED ORDER — KETOROLAC TROMETHAMINE 30 MG/ML IJ SOLN
30.0000 mg | Freq: Once | INTRAMUSCULAR | Status: AC
  Administered 2015-11-21: 30 mg via INTRAVENOUS
  Filled 2015-11-21: qty 1

## 2015-11-21 MED ORDER — SUMATRIPTAN SUCCINATE 100 MG PO TABS: 100.0000 mg | ORAL_TABLET | ORAL | Status: AC | PRN

## 2015-11-21 MED ORDER — METOCLOPRAMIDE HCL 5 MG/ML IJ SOLN
10.0000 mg | Freq: Once | INTRAMUSCULAR | Status: AC
  Administered 2015-11-21: 10 mg via INTRAVENOUS
  Filled 2015-11-21: qty 2

## 2015-11-21 NOTE — ED Notes (Signed)
Migraine x 1 day.  Reports that it feels like her normal migraines.  Pt very anxious, crying, vomiting in triage.  Pt reassured. Pt ran out of BP meds today.

## 2015-11-21 NOTE — ED Provider Notes (Signed)
CSN: [id number]     Arrival date & time 11/21/15  0003 History   First MD Initiated Contact with Patient 11/21/15 0230     Chief Complaint  Patient presents with  . Migraine     (Consider location/radiation/quality/duration/timing/severity/associated sxs/prior Treatment) HPI  This is a 40 year old female with a history of migraines. She is here with a migraine since yesterday. It is characterized as like previous migraines him a sharp and stabbing in nature. It is located across the forehead and radiates to the neck when she is lying supine. Her pain is severe enough to have her crying. There is associated photophobia, nausea and vomiting. She is taken over-the-counter analgesics without relief. She is out of most of her medications including Zomig and antihypertensives.  Past Medical History  Diagnosis Date  . Migraines   . Hypertension   . Thyroid disease   . Hyperthyroidism    History reviewed. No pertinent past surgical history. History reviewed. No pertinent family history. Social History  Substance Use Topics  . Smoking status: Never Smoker   . Smokeless tobacco: Never Used  . Alcohol Use: Yes     Comment: weekends   OB History    No data available     Review of Systems  All other systems reviewed and are negative.   Allergies  Review of patient's allergies indicates no known allergies.  Home Medications   Prior to Admission medications   Medication Sig Start Date End Date Taking? Authorizing Provider  cyclobenzaprine (FLEXERIL) 10 MG tablet Take 1 tablet (10 mg total) by mouth 3 (three) times daily as needed for muscle spasms. 12/13/12   Geoffery Lyons, MD  drospirenone-ethinyl estradiol Pierre Bali) 3-0.02 MG tablet Take 1 tablet by mouth daily.    Historical Provider, MD  labetalol (NORMODYNE) 100 MG tablet Take 100 mg by mouth 2 (two) times daily.    Historical Provider, MD  predniSONE (DELTASONE) 10 MG tablet Take 2 tablets (20 mg total) by mouth 2 (two)  times daily. 12/13/12   Geoffery Lyons, MD  propranolol (INDERAL) 80 MG tablet Take 80 mg by mouth 3 (three) times daily.    Historical Provider, MD  sertraline (ZOLOFT) 100 MG tablet Take 100 mg by mouth daily.    Historical Provider, MD  SUMAtriptan (IMITREX) 100 MG tablet Take 1 tablet (100 mg total) by mouth every 2 (two) hours as needed for migraine. May repeat in 2 hours if headache persists or recurs. 10/29/15   Courteney Lyn Mackuen, MD  traZODone (DESYREL) 50 MG tablet Take 50 mg by mouth at bedtime.    Historical Provider, MD  triamterene-hydrochlorothiazide (MAXZIDE-25) 37.5-25 MG per tablet Take 1 tablet by mouth daily.    Historical Provider, MD  zolpidem (AMBIEN) 10 MG tablet Take 10 mg by mouth at bedtime as needed for sleep.    Historical Provider, MD   BP 157/115 mmHg  Pulse 94  Temp(Src) 97.6 F (36.4 C) (Oral)  Resp 24  Ht  (1.575 m)  Wt 150 lb (68.04 kg)  BMI 27.43 kg/m2  SpO2 97%   Physical Exam  General: Well-developed, well-nourished female in no acute distress; appearance consistent with age of record HENT: normocephalic; atraumatic Eyes: pupils equal, round and reactive to light; extraocular muscles intact; photophobia Neck: supple Heart: regular rate and rhythm Lungs: clear to auscultation bilaterally Abdomen: soft; nondistended; nontender; no masses or hepatosplenomegaly; bowel sounds present Extremities: No deformity; full range of motion; pulses normal Neurologic: Awake, alert and oriented; motor function intact  in all extremities and symmetric; no facial droop; normal coordination, speech and gait Skin: Warm and dry Psychiatric: Flat affect    ED Course  Procedures (including critical care time)   MDM  5:23 AM Headache relieved after IV fluids and medications. Patient's blood pressure is noted to be elevated but she has been out of her antihypertensives for several days but plans to pick them up today.    Paula Libra, MD 11/21/15 [phone]
